# Patient Record
Sex: Female | Born: 1955 | Race: Black or African American | Hispanic: No | Smoking: Never smoker
Health system: Southern US, Community
[De-identification: ages and names within clinical notes are randomized; demographics above are authoritative.]

## PROBLEM LIST (undated history)

## (undated) DIAGNOSIS — I10 Essential (primary) hypertension: Secondary | ICD-10-CM

## (undated) DIAGNOSIS — E079 Disorder of thyroid, unspecified: Secondary | ICD-10-CM

## (undated) HISTORY — PX: ABDOMINAL HYSTERECTOMY: SHX81

---

## 2007-04-29 ENCOUNTER — Ambulatory Visit: Payer: Self-pay

## 2007-05-17 ENCOUNTER — Ambulatory Visit: Payer: Self-pay

## 2007-08-30 ENCOUNTER — Ambulatory Visit: Payer: Self-pay | Admitting: Specialist

## 2008-03-21 ENCOUNTER — Ambulatory Visit: Payer: Self-pay | Admitting: Specialist

## 2012-12-23 ENCOUNTER — Ambulatory Visit: Payer: Self-pay | Admitting: Unknown Physician Specialty

## 2017-07-01 ENCOUNTER — Other Ambulatory Visit: Payer: Self-pay | Admitting: Unknown Physician Specialty

## 2017-07-01 DIAGNOSIS — E042 Nontoxic multinodular goiter: Secondary | ICD-10-CM

## 2017-07-07 ENCOUNTER — Ambulatory Visit
Admission: RE | Admit: 2017-07-07 | Discharge: 2017-07-07 | Disposition: A | Discharge status: Home / Self Care | Payer: BLUE CROSS/BLUE SHIELD | Source: Ambulatory Visit | Attending: Unknown Physician Specialty | Admitting: Unknown Physician Specialty

## 2017-07-07 DIAGNOSIS — E042 Nontoxic multinodular goiter: Secondary | ICD-10-CM | POA: Diagnosis not present

## 2017-07-08 ENCOUNTER — Other Ambulatory Visit: Payer: Self-pay | Admitting: Unknown Physician Specialty

## 2017-07-08 DIAGNOSIS — E041 Nontoxic single thyroid nodule: Secondary | ICD-10-CM

## 2018-07-07 ENCOUNTER — Ambulatory Visit
Admission: RE | Admit: 2018-07-07 | Discharge: 2018-07-07 | Disposition: A | Discharge status: Home / Self Care | Payer: BLUE CROSS/BLUE SHIELD | Source: Ambulatory Visit | Attending: Unknown Physician Specialty | Admitting: Unknown Physician Specialty

## 2018-07-07 DIAGNOSIS — E041 Nontoxic single thyroid nodule: Secondary | ICD-10-CM | POA: Diagnosis not present

## 2018-07-11 ENCOUNTER — Other Ambulatory Visit: Payer: Self-pay | Admitting: Unknown Physician Specialty

## 2018-07-11 DIAGNOSIS — E041 Nontoxic single thyroid nodule: Secondary | ICD-10-CM

## 2019-06-08 ENCOUNTER — Ambulatory Visit: Payer: Self-pay | Admitting: Nurse Practitioner

## 2019-06-08 ENCOUNTER — Other Ambulatory Visit: Payer: Self-pay

## 2019-06-12 ENCOUNTER — Encounter: Payer: Self-pay | Admitting: Emergency Medicine

## 2019-06-12 ENCOUNTER — Emergency Department: Payer: BLUE CROSS/BLUE SHIELD

## 2019-06-12 ENCOUNTER — Other Ambulatory Visit: Payer: Self-pay

## 2019-06-12 ENCOUNTER — Emergency Department
Admission: EM | Admit: 2019-06-12 | Discharge: 2019-06-12 | Disposition: A | Discharge status: Home / Self Care | Payer: BLUE CROSS/BLUE SHIELD | Attending: Emergency Medicine | Admitting: Emergency Medicine

## 2019-06-12 DIAGNOSIS — I10 Essential (primary) hypertension: Secondary | ICD-10-CM | POA: Diagnosis not present

## 2019-06-12 HISTORY — DX: Disorder of thyroid, unspecified: E07.9

## 2019-06-12 HISTORY — DX: Essential (primary) hypertension: I10

## 2019-06-12 LAB — CBC
HCT: 40.1 % (ref 36.0–46.0)
Hemoglobin: 13 g/dL (ref 12.0–15.0)
MCH: 26.9 pg (ref 26.0–34.0)
MCHC: 32.4 g/dL (ref 30.0–36.0)
MCV: 83 fL (ref 80.0–100.0)
Platelets: 308 10*3/uL (ref 150–400)
RBC: 4.83 MIL/uL (ref 3.87–5.11)
RDW: 12.7 % (ref 11.5–15.5)
WBC: 7.4 10*3/uL (ref 4.0–10.5)
nRBC: 0 % (ref 0.0–0.2)

## 2019-06-12 LAB — BASIC METABOLIC PANEL
Anion gap: 12 (ref 5–15)
BUN: 8 mg/dL (ref 8–23)
CO2: 26 mmol/L (ref 22–32)
Calcium: 9.3 mg/dL (ref 8.9–10.3)
Chloride: 99 mmol/L (ref 98–111)
Creatinine, Ser: 1.06 mg/dL — ABNORMAL HIGH (ref 0.44–1.00)
GFR calc Af Amer: >60 mL/min (ref >60)
GFR calc non Af Amer: 56 mL/min — ABNORMAL LOW (ref >60)
Glucose, Bld: 155 mg/dL — ABNORMAL HIGH (ref 70–99)
Potassium: 3.8 mmol/L (ref 3.5–5.1)
Sodium: 137 mmol/L (ref 135–145)

## 2019-06-12 LAB — TROPONIN I (HIGH SENSITIVITY): Troponin I (High Sensitivity): 15 ng/L (ref <18)

## 2019-06-12 NOTE — ED Notes (Signed)
No peripheral IV placed this visit.   Discharge instructions reviewed with patient. Questions fielded by this RN. Patient verbalizes understanding of instructions. Patient discharged home in stable condition per forbach. No acute distress noted at time of discharge.   the patient changing clothes and given given directions to lobby

## 2019-06-12 NOTE — ED Provider Notes (Signed)
[facility] Emergency Department Provider Note  ____________________________________________   First MD Initiated Contact with Patient 06/12/19 0214     (approximate)  I have reviewed the triage vital signs and the nursing notes.   HISTORY  Chief Complaint Hypertension    HPI Jody Anderson is a 64 y.o. female with medical history as listed below who presents for evaluation of hypertension.  She was just started last week on amlodipine 5 mg nightly by her primary care provider.  She said that her numbers have been better recently although they are variable.  Tonight she took her medicine at about 9 PM but she had been feeling well and she took her blood pressure afterwards and it was elevated at about 202/102.  She said the primarily she has been feeling lightheaded tonight.  She became concerned about the elevated numbers particularly because she has started on her medications so she was concerned she should be checked out.  The blood pressure has been a long-term issue, gradual in onset, although is acutely higher tonight.  The lightheadedness has been intermittent in the past and was gradual in onset today as well.  She has no difficulty with ambulation, no difficulty with motor control, no difficulty with speech.  No visual changes.  No headache.  She denies fever/chills, chest pain, shortness of breath, cough, nausea, vomiting, and abdominal pain.  She had a salad with ranch dressing earlier, but she does not think she ate anything particularly salty.  Nothing in particular makes the symptoms better or worse.         Past Medical History:  Diagnosis Date  . Hypertension   . Thyroid disease     There are no problems to display for this patient.   Past Surgical History:  Procedure Laterality Date  . ABDOMINAL HYSTERECTOMY      Prior to Admission medications   Not on File    Allergies Patient has no known allergies.  No family history on  file.  Social History Social History   Tobacco Use  . Smoking status: Never Smoker  . Smokeless tobacco: Never Used  Substance Use Topics  . Alcohol use: Not on file  . Drug use: Not on file    Review of Systems Constitutional: No fever/chills Eyes: No visual changes. ENT: No sore throat. Cardiovascular: Denies chest pain. Respiratory: Denies shortness of breath. Gastrointestinal: No abdominal pain.  No nausea, no vomiting.  No diarrhea.  No constipation. Genitourinary: Negative for dysuria. Musculoskeletal: Negative for neck pain.  Negative for back pain. Integumentary: Negative for rash. Neurological: Lightheadedness/dizziness.  Negative for headaches, focal weakness or numbness.   ____________________________________________   PHYSICAL EXAM:  VITAL SIGNS: ED Triage Vitals  Enc Vitals Group     BP 06/12/19 0126 (!) 191/88     Pulse Rate 06/12/19 0126 (!) 105     Resp 06/12/19 0126 18     Temp 06/12/19 0126 98.5 F (36.9 C)     Temp Source 06/12/19 0126 Oral     SpO2 06/12/19 0126 100 %     Weight 06/12/19 0121 84.4 kg (186 lb)     Height 06/12/19 0121 1.689 m (5' 6.5")     Head Circumference --      Peak Flow --      Pain Score 06/12/19 0121 0     Pain Loc --      Pain Edu? --      Excl. in GC? --  Constitutional: Alert and oriented.  Well-appearing and in no distress. Eyes: Conjunctivae are normal.  Pupils are equal and reactive bilaterally.  No nystagmus at extremes of lateral and vertical gaze. Head: Atraumatic. Nose: No congestion/rhinnorhea. Mouth/Throat: Patient is wearing a mask. Neck: No stridor.  No meningeal signs.   Cardiovascular: Normal rate, regular rhythm. Good peripheral circulation. Grossly normal heart sounds. Respiratory: Normal respiratory effort.  No retractions. Gastrointestinal: Soft and nontender. No distention.  Musculoskeletal: No lower extremity tenderness nor edema. No gross deformities of extremities. Neurologic:  Normal  speech and language. No gross focal neurologic deficits are appreciated.  Skin:  Skin is warm, dry and intact. Psychiatric: Mood and affect are normal. Speech and behavior are normal.  ____________________________________________   LABS (all labs ordered are listed, but only abnormal results are displayed)  Labs Reviewed  BASIC METABOLIC PANEL - Abnormal; Notable for the following components:      Result Value   Glucose, Bld 155 (*)    Creatinine, Ser 1.06 (*)    GFR calc non Af Amer 56 (*)    All other components within normal limits  CBC  TROPONIN I (HIGH SENSITIVITY)   ____________________________________________  EKG  ED ECG REPORT I, Loleta Rose, the attending physician, personally viewed and interpreted this ECG.  Date: 06/12/2019 EKG Time: 1:30 AM Rate: 96 Rhythm: normal sinus rhythm QRS Axis: normal Intervals: normal ST/T Wave abnormalities: normal Narrative Interpretation: no evidence of acute ischemia  ____________________________________________  RADIOLOGY I, Loleta Rose, personally viewed and evaluated these images (plain radiographs) as part of my medical decision making, as well as reviewing the written report by the radiologist.  ED MD interpretation:  No acute abnormalities  Official radiology report(s): DG Chest 2 View  Result Date: 06/12/2019 CLINICAL DATA:  Dizziness and hypertension EXAM: CHEST - 2 VIEW COMPARISON:  04/29/2007 FINDINGS: The heart size and mediastinal contours are within normal limits. Both lungs are clear. The visualized skeletal structures are unremarkable. IMPRESSION: No acute abnormality noted. Electronically Signed   By: Alcide Clever M.D.   On: 06/12/2019 01:55    ____________________________________________   PROCEDURES   Procedure(s) performed (including Critical Care):  Procedures   ____________________________________________   INITIAL IMPRESSION / MDM / ASSESSMENT AND PLAN / ED COURSE  As part of my medical  decision making, I reviewed the following data within the electronic MEDICAL RECORD NUMBER Nursing notes reviewed and incorporated, Labs reviewed , EKG interpreted , Old chart reviewed and Notes from prior ED visits   Differential diagnosis includes, but is not limited to, essential hypertension, uncontrolled or malignant hypertension, CVA, TIA.  The patient is neurologically intact with no focal abnormalities.  She says she is feeling quite a bit better especially after we talked.  She is still hypertensive in the emergency department but she has no evidence of endorgan dysfunction with reassuring lab work.  She has had no chest pain and I would not have checked a troponin if it had not been ordered in triage by the high-sensitivity troponin is still less than what is considered within normal limits, particular for someone without chest pain.  No ischemic changes on EKG.  We had my usual and customary essential hypertension discussion and she is very comfortable with the plan for discharge and outpatient follow-up with her primary provider.  We decided to not give any additional oral medication in the ED so that she could have an accurate journal of her blood pressures with which to follow-up with her PCP and not  have a falsely lowered number after an additional dose of medication tonight.  I gave my usual and customary return precautions.          ____________________________________________  FINAL CLINICAL IMPRESSION(S) / ED DIAGNOSES  Final diagnoses:  Essential hypertension     MEDICATIONS GIVEN DURING THIS VISIT:  Medications - No data to display   ED Discharge Orders    None      *Please note:  LOLITA FAULDS was evaluated in Emergency Department on 06/12/2019 for the symptoms described in the history of present illness. She was evaluated in the context of the global COVID-19 pandemic, which necessitated consideration that the patient might be at risk for infection with the SARS-CoV-2  virus that causes COVID-19. Institutional protocols and algorithms that pertain to the evaluation of patients at risk for COVID-19 are in a state of rapid change based on information released by regulatory bodies including the CDC and federal and state organizations. These policies and algorithms were followed during the patient's care in the ED.  Some ED evaluations and interventions may be delayed as a result of limited staffing during the pandemic.*  Note:  This document was prepared using Dragon voice recognition software and may include unintentional dictation errors.   Hinda Kehr, MD 06/12/19 [phone]

## 2019-06-12 NOTE — Discharge Instructions (Signed)

## 2019-06-12 NOTE — ED Notes (Addendum)
EKG completed in triage  Pt roomed by triage and assessed by EDP and set for DC whilst this RN attended critical pt next door; pt in NAD, reports recent BP meds started last week, pt reports high BP and dizziness at home and come to ED, denies recent salt intake (salald today) or stress, denies HA or pain  Pt appears calm and relaxed; reports ready to be discharged

## 2019-06-12 NOTE — ED Triage Notes (Signed)
Patient with stating that her blood pressure was 202/102 at home. Patient states that she has a history of htn but that it is normally well controlled. Patient states that she is feeling dizzy. Patient denies chest pain.

## 2019-07-10 ENCOUNTER — Other Ambulatory Visit: Payer: Self-pay

## 2019-07-10 ENCOUNTER — Ambulatory Visit
Admission: RE | Admit: 2019-07-10 | Discharge: 2019-07-10 | Disposition: A | Discharge status: Home / Self Care | Payer: BLUE CROSS/BLUE SHIELD | Source: Ambulatory Visit | Attending: Unknown Physician Specialty | Admitting: Unknown Physician Specialty

## 2019-07-10 DIAGNOSIS — E041 Nontoxic single thyroid nodule: Secondary | ICD-10-CM

## 2020-09-19 IMAGING — CR DG CHEST 2V
1 series · 2 of 2 positions shown · non-contrast
Comparison: 04/29/2007

CLINICAL DATA: Dizziness and hypertension

EXAM:
CHEST - 2 VIEW

[Series 1: dg chest 2 view · 0.14mm/px · 2 of 2 slices shown]
[im 1/2]
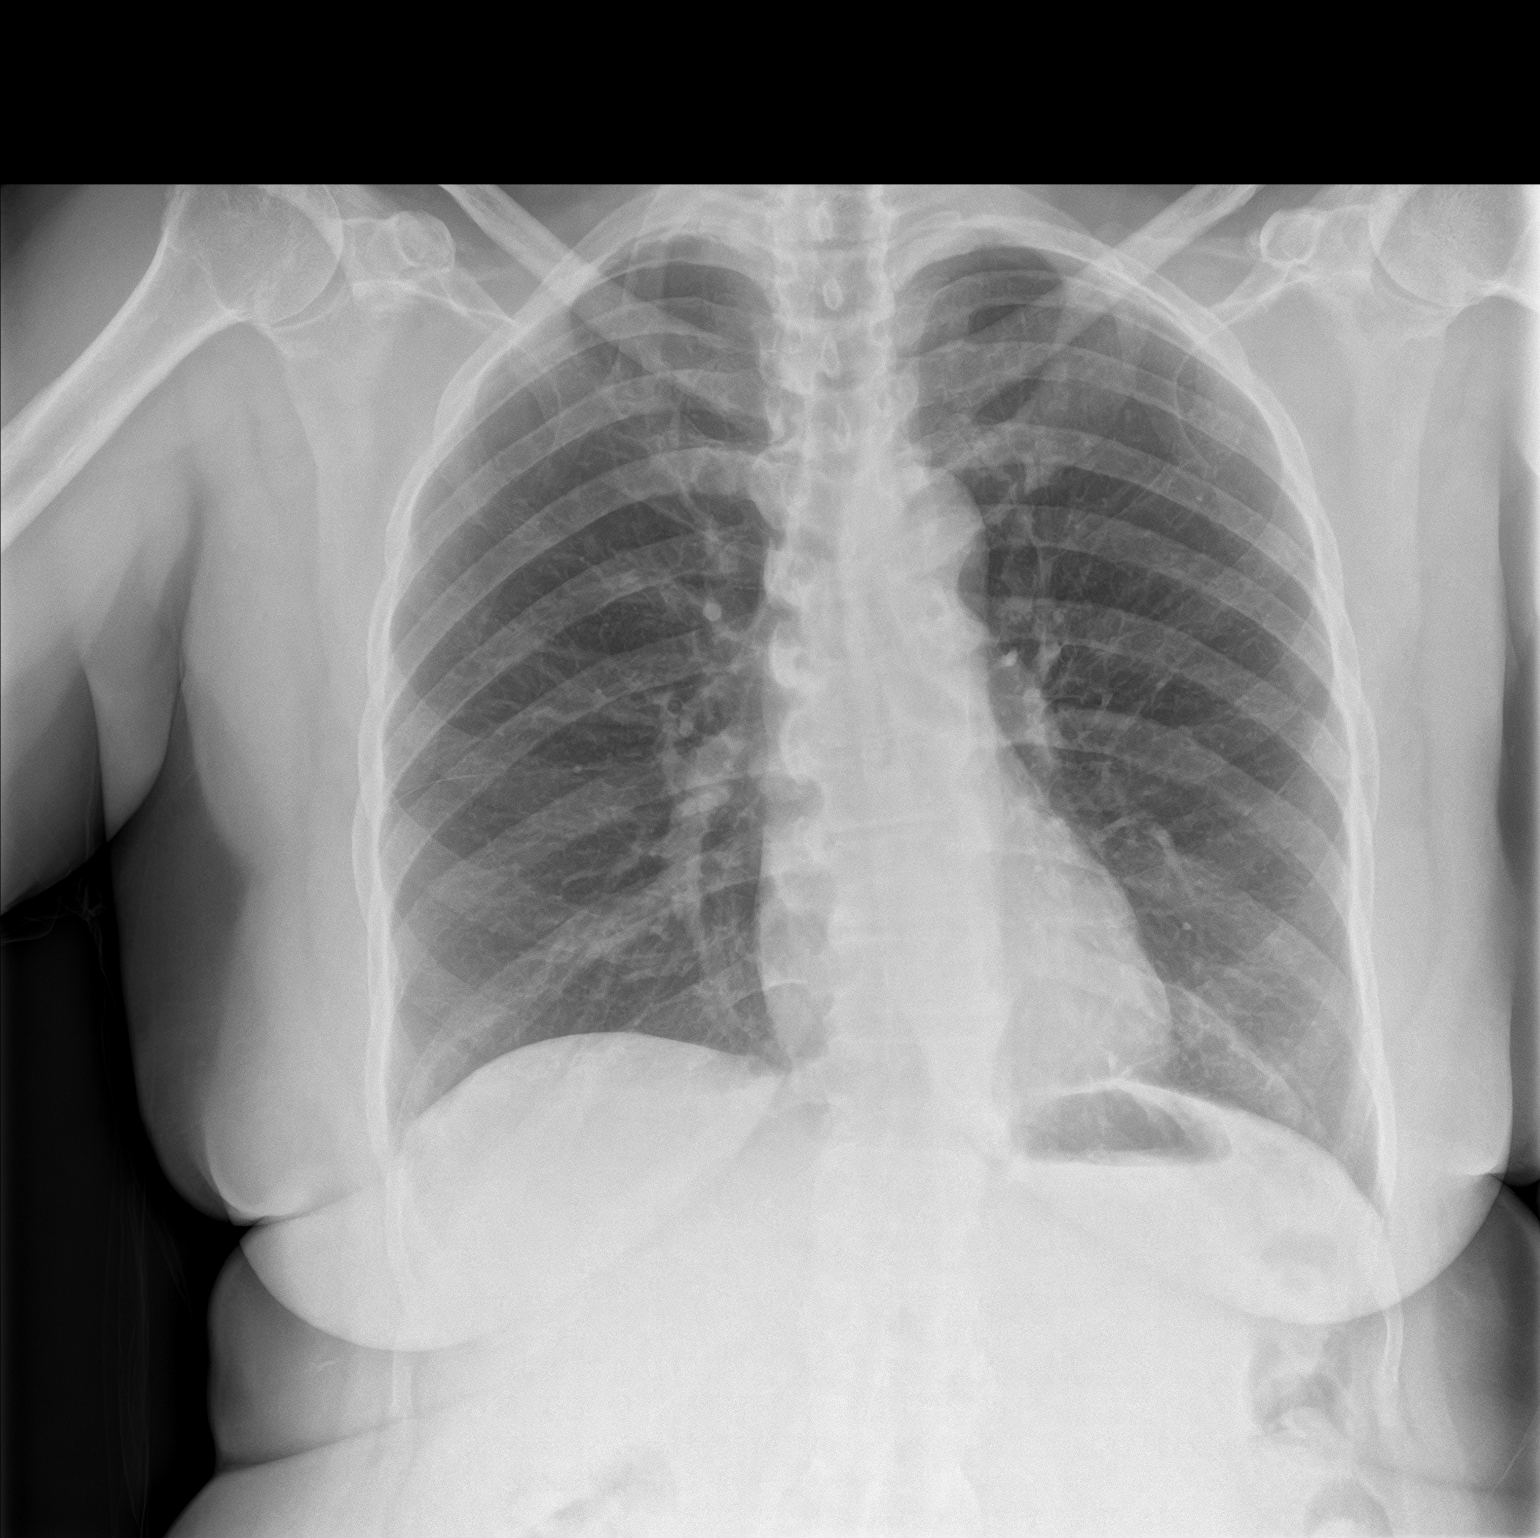
[im 2/2]
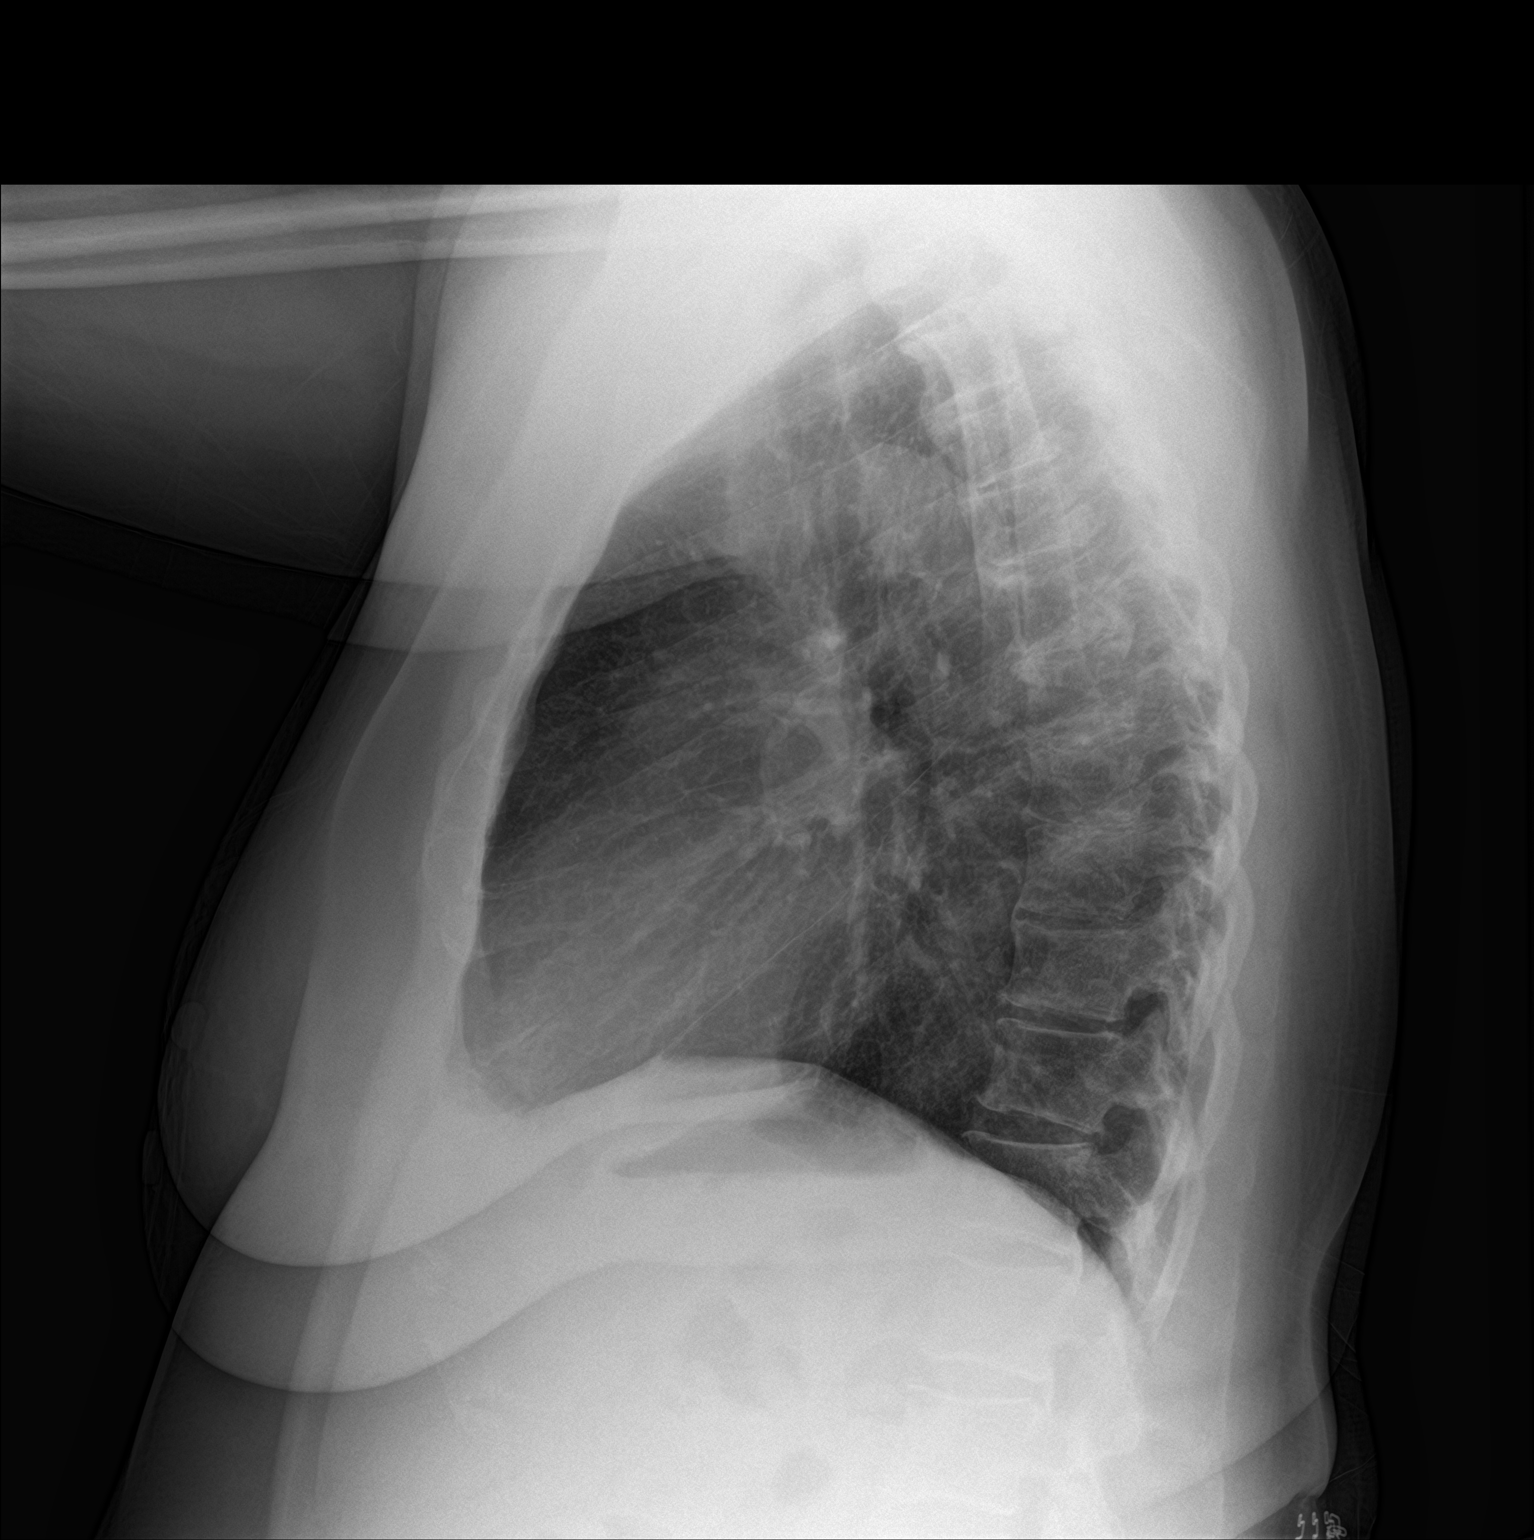

[2 of 2 positions shown; findings below may reference images not displayed]

FINDINGS: The heart size and mediastinal contours are within normal limits.
Both lungs are clear. The visualized skeletal structures are
unremarkable.
IMPRESSION: No acute abnormality noted.

## 2020-10-17 IMAGING — US US THYROID
1 series · 13 of 25 positions shown · non-contrast
Comparison: Prior thyroid ultrasound 07/07/2018 and 12/23/2012

CLINICAL DATA: Prior ultrasound follow-up. 63-year-old female with
a history of left-sided thyroid nodule currently under surveillance.

EXAM:
THYROID ULTRASOUND
TECHNIQUE: Ultrasound examination of the thyroid gland and adjacent soft
tissues was performed.

[Series 1: us thyroid · 0.07mm/px · 13 of 49 slices shown]
[im 1/49]
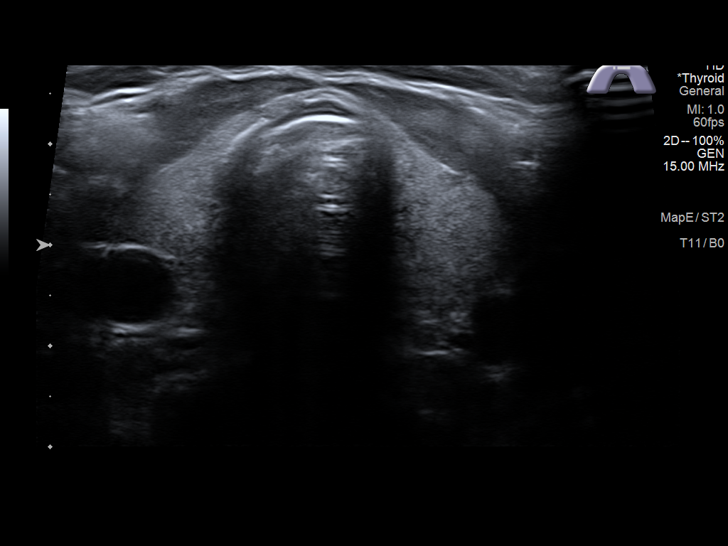
[im 5/49]
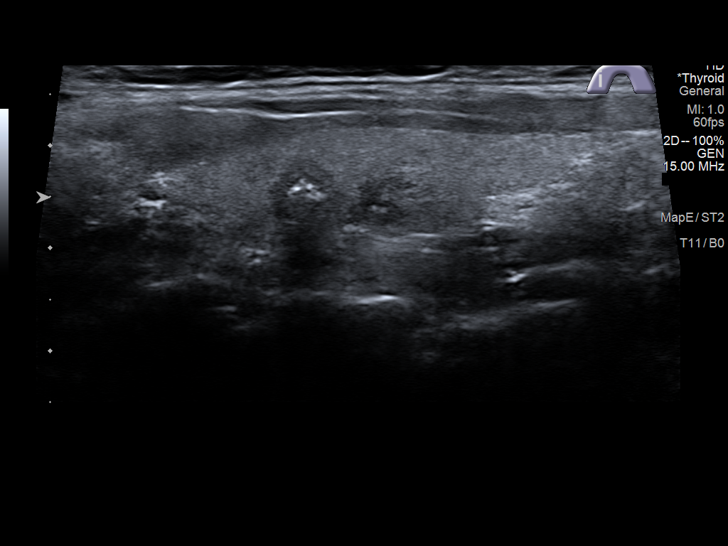
[im 9/49]
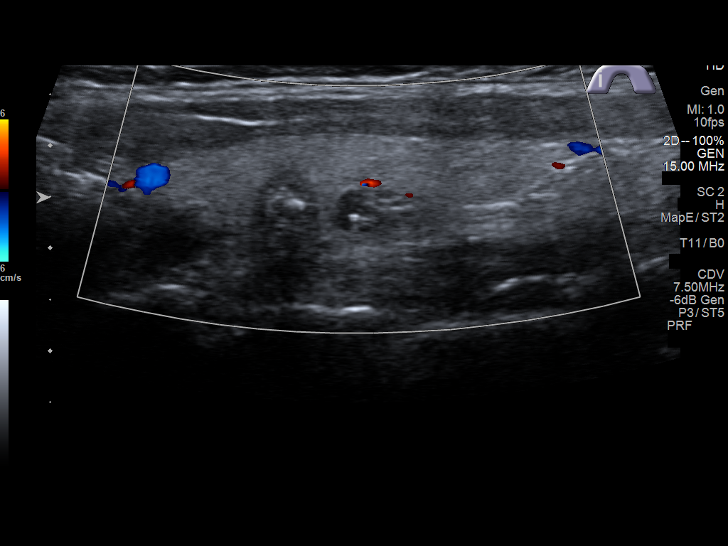
[im 13/49]
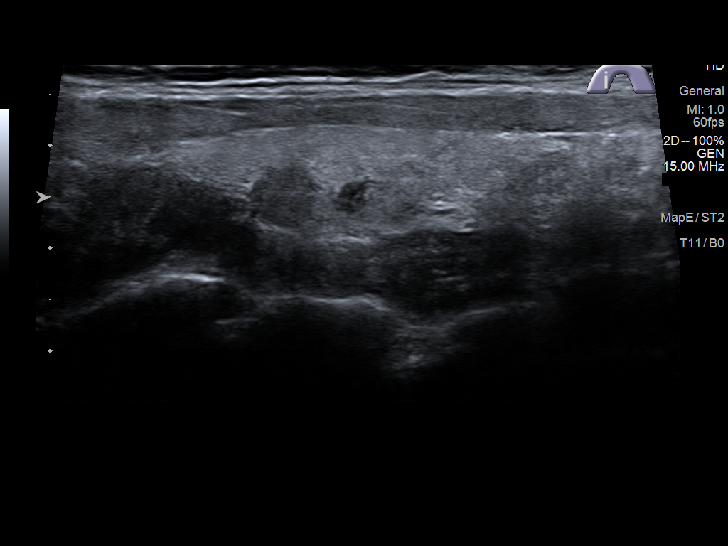
[im 17/49]
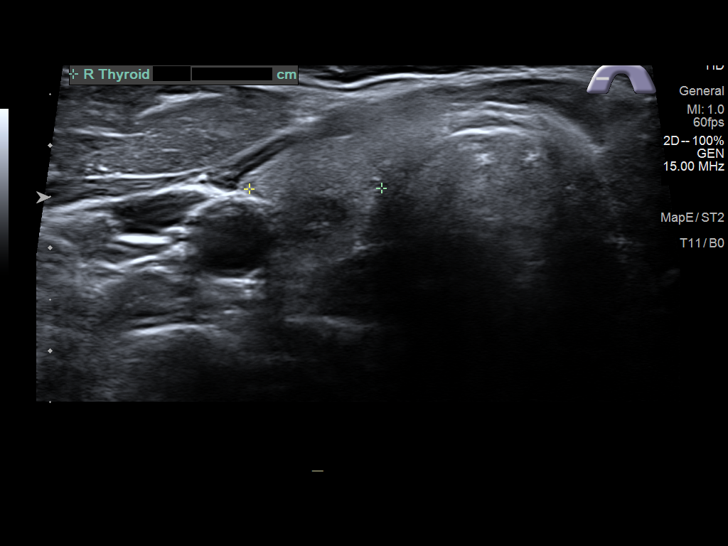
[im 21/49]
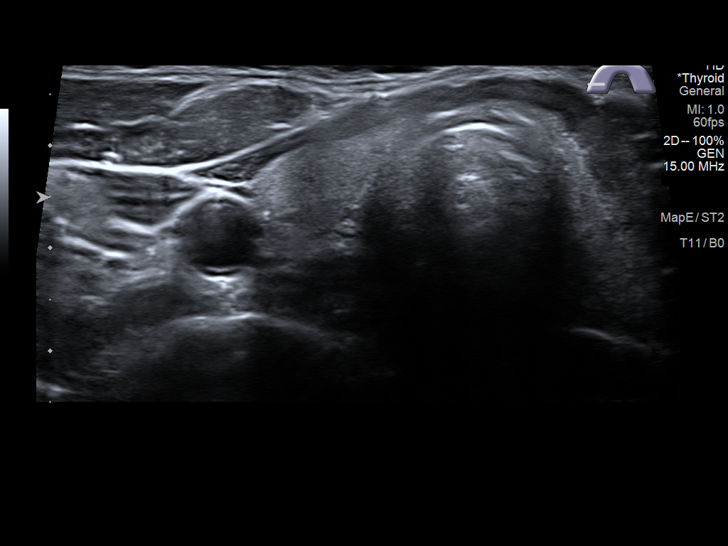
[im 25/49]
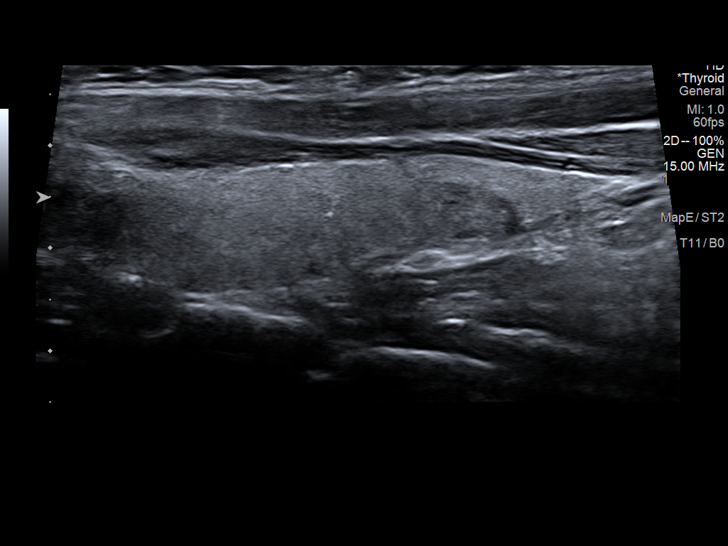
[im 29/49]
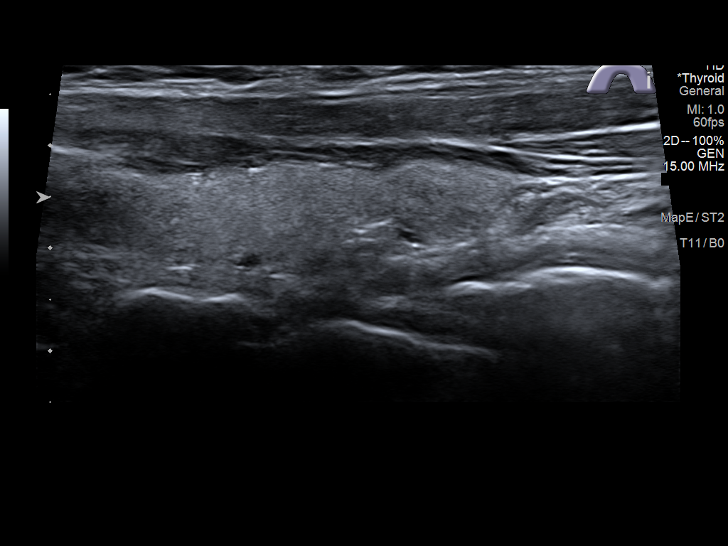
[im 33/49]
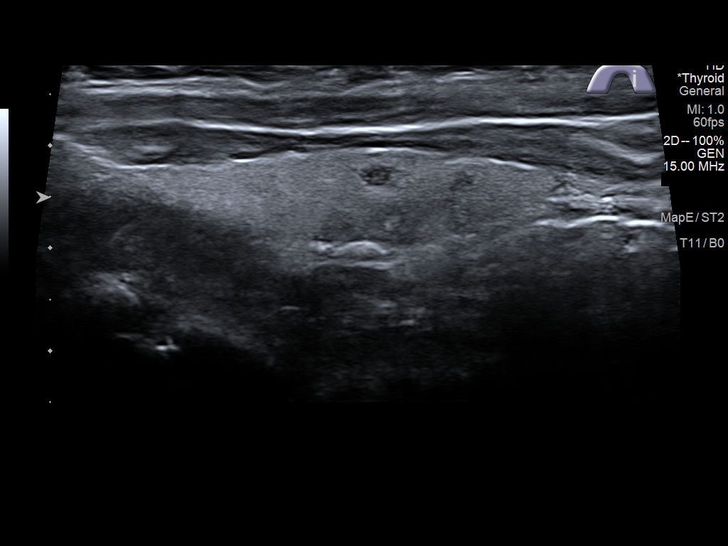
[im 37/49]
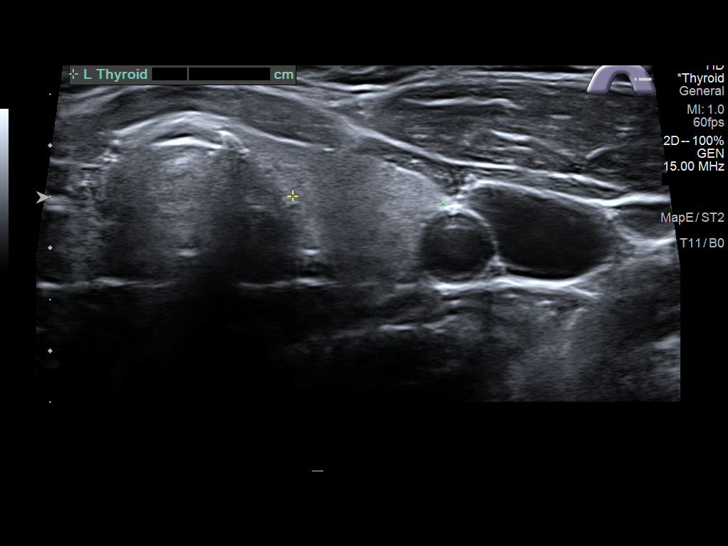
[im 41/49]
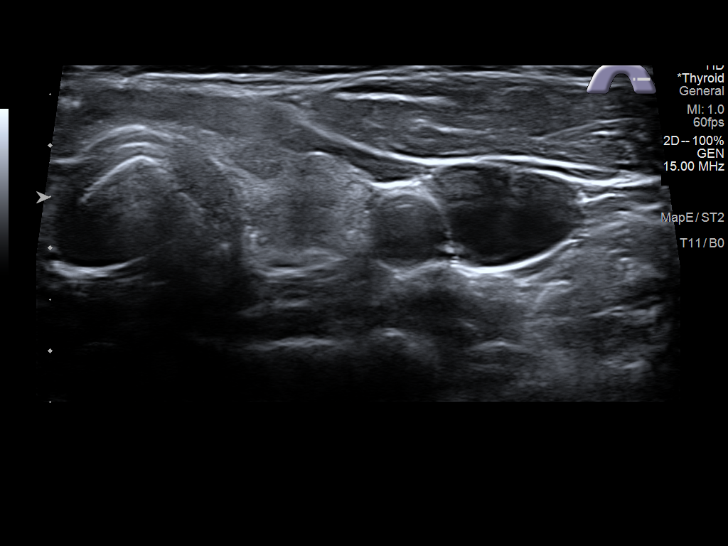
[im 45/49]
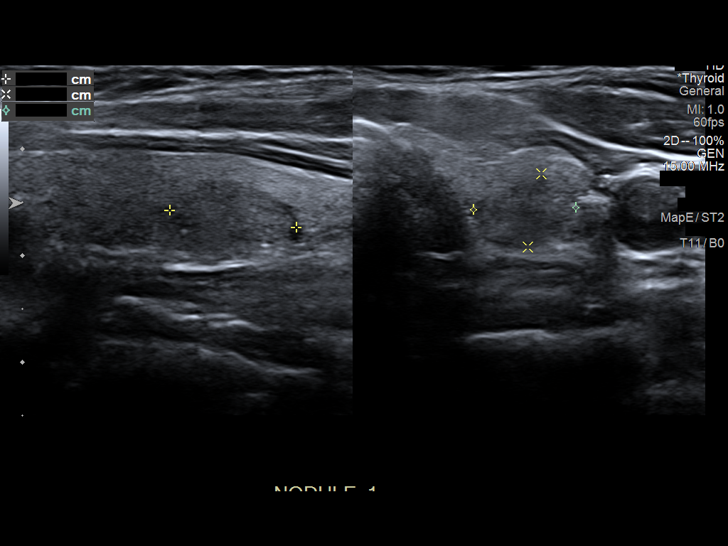
[im 49/49]
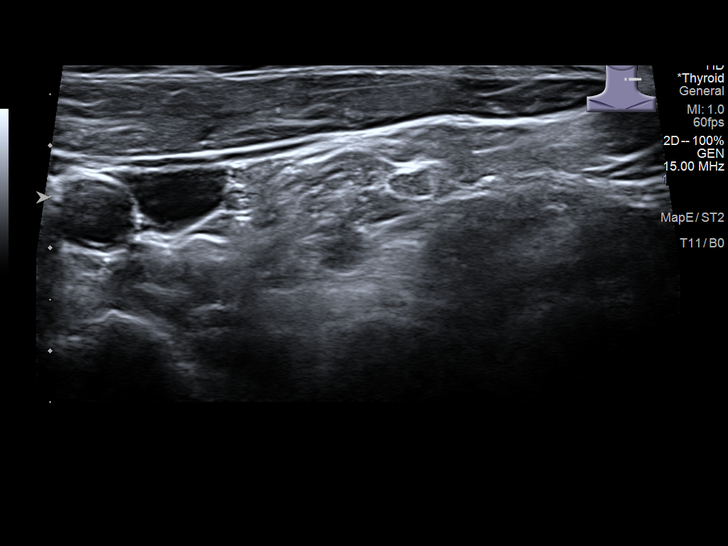

[13 of 25 positions shown; findings below may reference images not displayed]

FINDINGS: Parenchymal Echotexture: Mildly heterogenous

Isthmus: 0.2 cm

Right lobe: 4.4 x 1.1 x 1.3 cm

Left lobe: 4.6 x 1.1 x 1.5 cm

_________________________________________________________

Estimated total number of nodules >/= 1 cm: 1

Number of spongiform nodules >/=  2 cm not described below (TR1): 0

Number of mixed cystic and solid nodules >/= 1.5 cm not described
below (TR2): 0

_________________________________________________________

Nodule # 1: The solid hypoechoic nodule in the inferior aspect of
the left thyroid gland measures 1.2 x 0.7 x 1.0 cm, insignificantly
changed compared to 1.3 x 0.6 x 1.0 cm previously. Furthermore, this
nodule was identified and measured at 1.3 x 0.9 x 1.1 cm on the more
remote study from Sunday December, 2012. Greater than 6 year stability is
consistent with benignity. No further follow-up is required.

Additional small bilateral thyroid nodules again noted incidentally.
No significant interval change. None of these lesions meets criteria
for surveillance or biopsy.
IMPRESSION: Confirmed greater than 5 year stability of left lower pole thyroid
nodule. This nodule is now considered benign and no further
surveillance is required.

The above is in keeping with the ACR TI-RADS recommendations - [HOSPITAL] 7513;[DATE].

## 2020-11-07 ENCOUNTER — Other Ambulatory Visit: Payer: Self-pay | Admitting: Unknown Physician Specialty

## 2020-11-07 DIAGNOSIS — E042 Nontoxic multinodular goiter: Secondary | ICD-10-CM

## 2020-12-05 ENCOUNTER — Ambulatory Visit
Admission: RE | Admit: 2020-12-05 | Discharge: 2020-12-05 | Disposition: A | Discharge status: Home / Self Care | Payer: BLUE CROSS/BLUE SHIELD | Source: Ambulatory Visit | Attending: Unknown Physician Specialty | Admitting: Unknown Physician Specialty

## 2020-12-05 ENCOUNTER — Other Ambulatory Visit: Payer: Self-pay

## 2020-12-05 DIAGNOSIS — E042 Nontoxic multinodular goiter: Secondary | ICD-10-CM | POA: Insufficient documentation

## 2022-03-15 IMAGING — US US THYROID
1 series · 13 of 25 positions shown · non-contrast
Comparison: 07/10/2019 and previous

CLINICAL DATA: Goiter.

EXAM:
THYROID ULTRASOUND
TECHNIQUE: Ultrasound examination of the thyroid gland and adjacent soft
tissues was performed.

[Series 1: us thyroid · 13 of 35 slices shown]
[im 1/35]
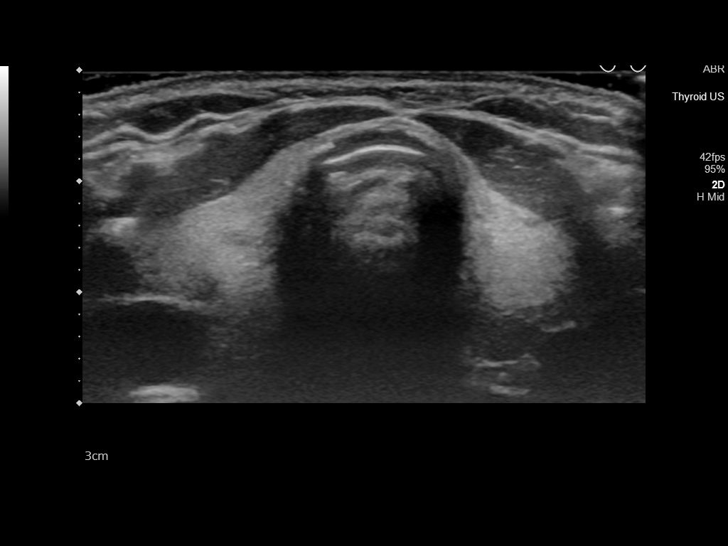
[im 3/35]
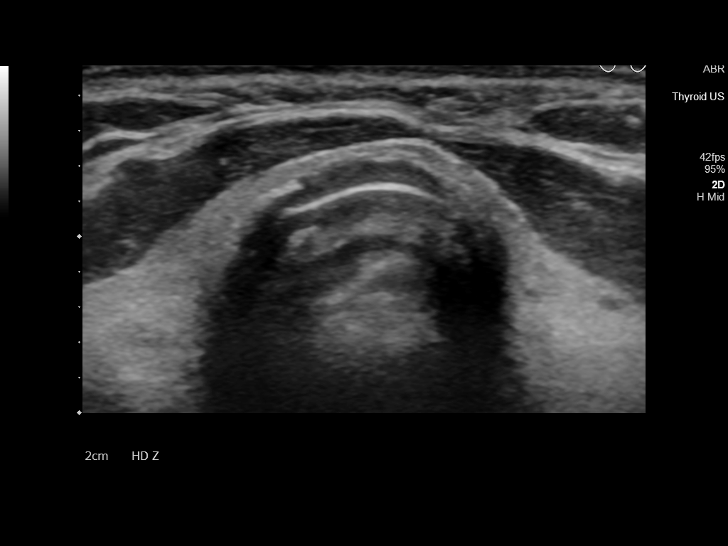
[im 6/35]
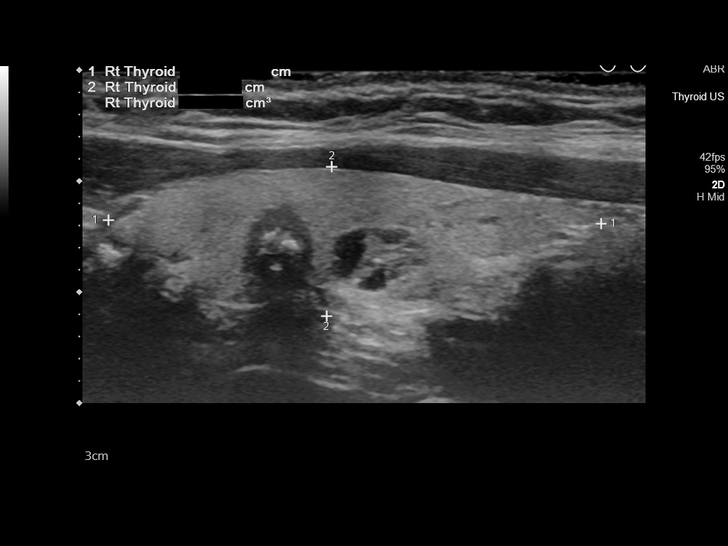
[im 9/35]
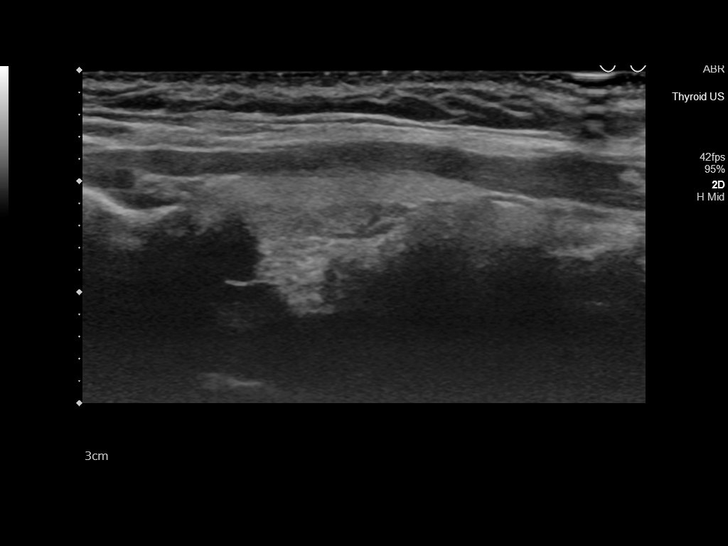
[im 12/35]
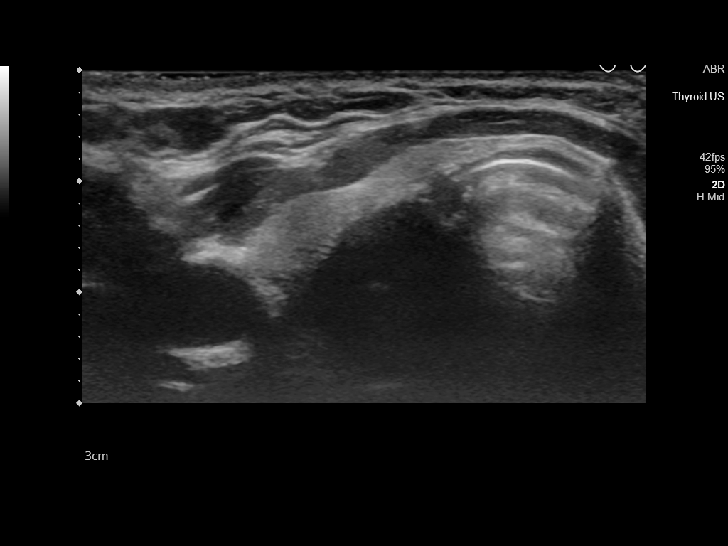
[im 15/35]
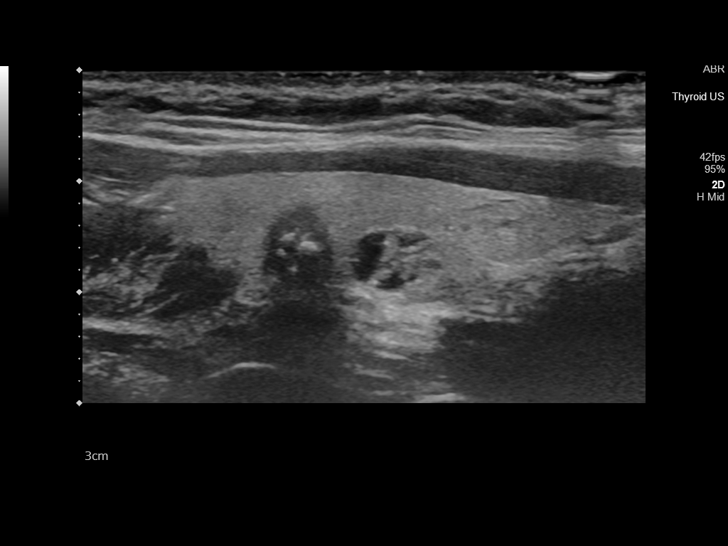
[im 18/35]
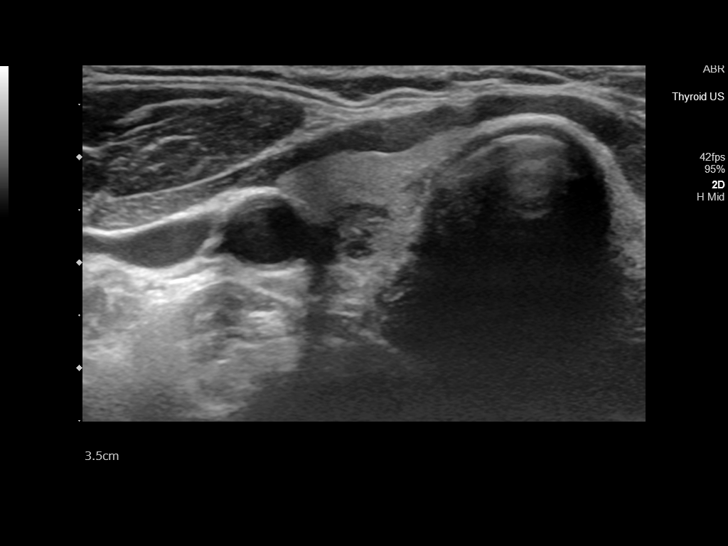
[im 20/35]
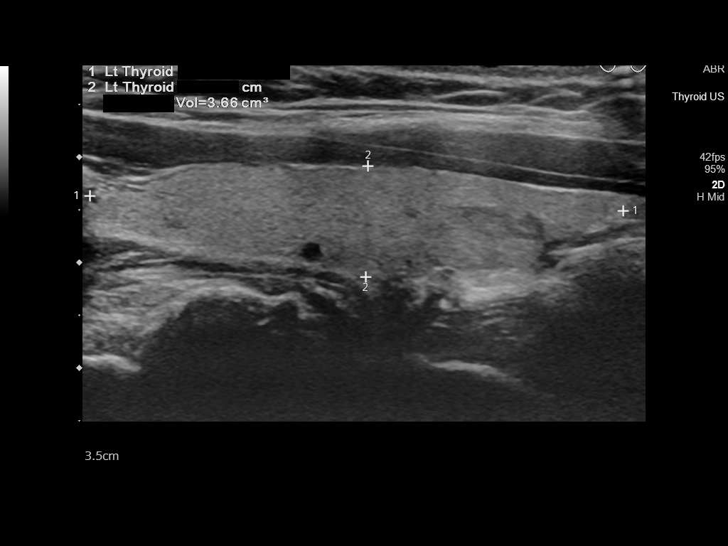
[im 23/35]
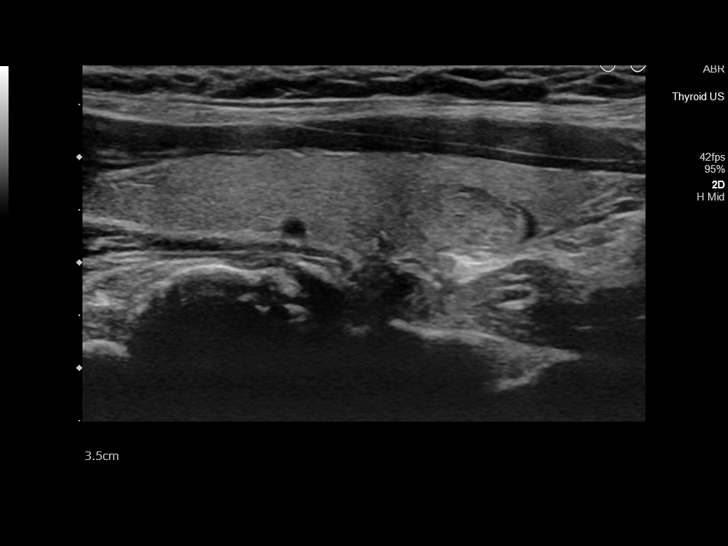
[im 26/35]
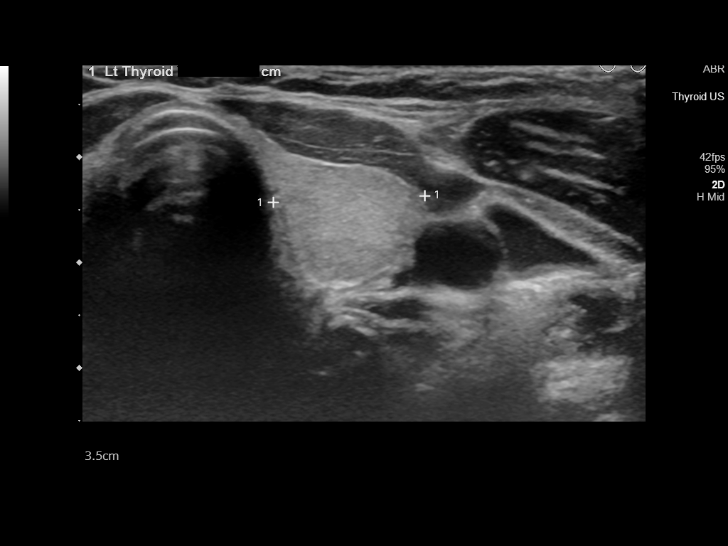
[im 29/35]
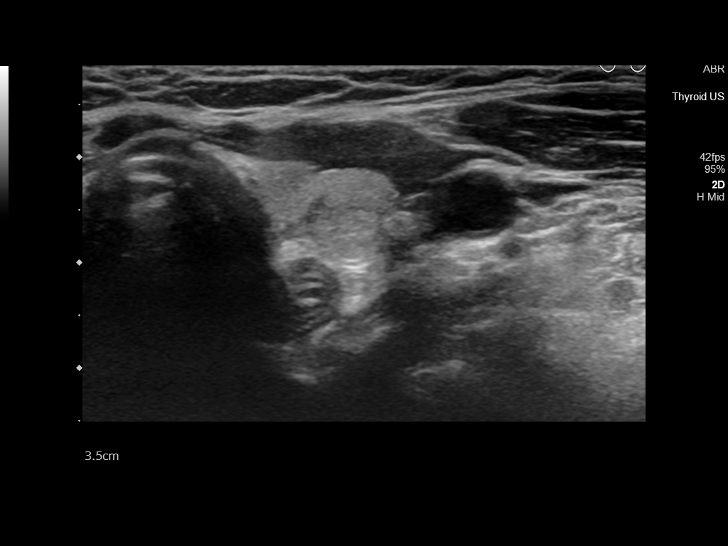
[im 32/35]
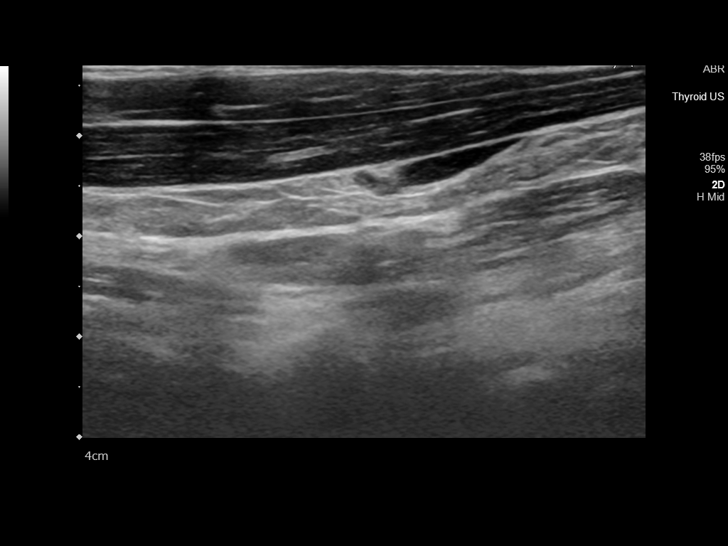
[im 35/35]
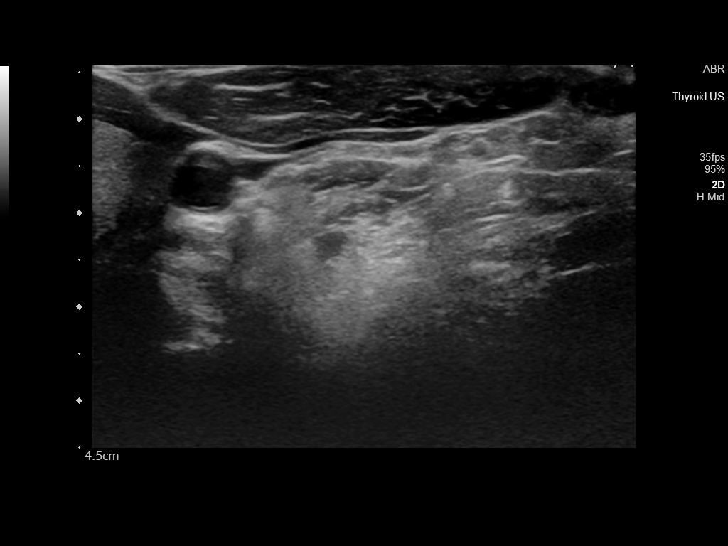

[13 of 25 positions shown; findings below may reference images not displayed]

FINDINGS: Parenchymal Echotexture: Mildly heterogenous

Isthmus: 0.1 cm thickness, previously

Right lobe: 4.4 x 1.4 x 1.3 cm, previously 4.4 x 1.1 x

Left lobe: 5.1 x 1.1 x 1.4 cm, previously 4.6 x 1.1 x

_________________________________________________________

Estimated total number of nodules >/= 1 cm: 1

Number of spongiform nodules >/=  2 cm not described below (TR1): 0

Number of mixed cystic and solid nodules >/= 1.5 cm not described
below (TR2): 0

_________________________________________________________

Nodule # 1: 0.6 cm hypoechoic nodule, mid right, previously 0.7 cm
on 12/23/2012; stability for greater than 5 years implies benignity.

Nodule # 2: 0.9 cm complex inferior right nodule, previously 0.8 cm
on 12/23/2012; stability for greater than 5 years implies benignity.

Nodule # 3: 1.1 cm isoechoic inferior left nodule, previously 1.3 on
12/23/2012; stability for greater than 5 years implies benignity.
IMPRESSION: 1. Normal-sized thyroid with stable bilateral nodules since 0558. No
indication for biopsy or follow-up.

The above is in keeping with the ACR TI-RADS recommendations - [HOSPITAL] 9980;[DATE].
